# Patient Record
Sex: Female | Born: 2000 | Race: Black or African American | Hispanic: No | Marital: Single | Smoking: Never smoker
Health system: Southern US, Community
[De-identification: ages and names within clinical notes are randomized; demographics above are authoritative.]

## PROBLEM LIST (undated history)

## (undated) ENCOUNTER — Ambulatory Visit (HOSPITAL_COMMUNITY): Discharge status: Absent without leave | Payer: PRIVATE HEALTH INSURANCE

---
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
## 2023-02-23 ENCOUNTER — Emergency Department (HOSPITAL_COMMUNITY)
Admission: EM | Admit: 2023-02-23 | Discharge: 2023-02-24 | Disposition: A | Discharge status: Home / Self Care | Payer: PRIVATE HEALTH INSURANCE | Attending: Emergency Medicine | Admitting: Emergency Medicine

## 2023-02-23 ENCOUNTER — Emergency Department (HOSPITAL_COMMUNITY): Payer: PRIVATE HEALTH INSURANCE

## 2023-02-23 ENCOUNTER — Encounter (HOSPITAL_COMMUNITY): Payer: Self-pay

## 2023-02-23 DIAGNOSIS — Y92410 Unspecified street and highway as the place of occurrence of the external cause: Secondary | ICD-10-CM | POA: Insufficient documentation

## 2023-02-23 DIAGNOSIS — M25571 Pain in right ankle and joints of right foot: Secondary | ICD-10-CM | POA: Diagnosis not present

## 2023-02-23 DIAGNOSIS — M25531 Pain in right wrist: Secondary | ICD-10-CM | POA: Diagnosis present
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
## 2023-02-24 ENCOUNTER — Emergency Department (HOSPITAL_COMMUNITY): Payer: PRIVATE HEALTH INSURANCE
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
## 2023-02-24 NOTE — ED Notes (Signed)
Offered patient a wheelchair at the time of D/C and she reports "I can walk."

## 2023-02-24 NOTE — Discharge Instructions (Addendum)
Your imaging tonight was reassuring.  There was no sign of fracture or dislocation on imaging.  Please follow-up as needed with your primary care provider.  You may take ibuprofen or acetaminophen for pain and inflammation control.  Consider icing and elevating the affected extremity.
